# Patient Record
Sex: Male | Born: 1990 | Race: White | Hispanic: No | Marital: Single | Smoking: Current every day smoker
Health system: Southern US, Community
[De-identification: ages and names within clinical notes are randomized; demographics above are authoritative.]

---

## 2016-12-17 ENCOUNTER — Other Ambulatory Visit: Payer: Self-pay

## 2016-12-17 ENCOUNTER — Emergency Department (INDEPENDENT_AMBULATORY_CARE_PROVIDER_SITE_OTHER): Payer: BLUE CROSS/BLUE SHIELD

## 2016-12-17 ENCOUNTER — Emergency Department
Admission: EM | Admit: 2016-12-17 | Discharge: 2016-12-17 | Disposition: A | Discharge status: Home / Self Care | Payer: BLUE CROSS/BLUE SHIELD | Source: Home / Self Care | Attending: Family Medicine | Admitting: Family Medicine

## 2016-12-17 ENCOUNTER — Encounter: Payer: Self-pay | Admitting: *Deleted

## 2016-12-17 DIAGNOSIS — F17210 Nicotine dependence, cigarettes, uncomplicated: Secondary | ICD-10-CM

## 2016-12-17 DIAGNOSIS — R0602 Shortness of breath: Secondary | ICD-10-CM

## 2016-12-17 DIAGNOSIS — I10 Essential (primary) hypertension: Secondary | ICD-10-CM

## 2016-12-17 DIAGNOSIS — R079 Chest pain, unspecified: Secondary | ICD-10-CM

## 2016-12-17 LAB — POCT CBC W AUTO DIFF (K'VILLE URGENT CARE)

## 2016-12-17 LAB — COMPLETE METABOLIC PANEL WITH GFR
AG Ratio: 2.2 (calc) (ref 1.0–2.5)
ALT: 19 U/L (ref 9–46)
AST: 18 U/L (ref 10–40)
Albumin: 5 g/dL (ref 3.6–5.1)
Alkaline phosphatase (APISO): 36 U/L — ABNORMAL LOW (ref 40–115)
BUN: 8 mg/dL (ref 7–25)
CO2: 27 mmol/L (ref 20–32)
Calcium: 10 mg/dL (ref 8.6–10.3)
Chloride: 104 mmol/L (ref 98–110)
Creat: 0.81 mg/dL (ref 0.60–1.35)
GFR, Est African American: 142 mL/min/{1.73_m2} (ref >=60)
GFR, Est Non African American: 123 mL/min/{1.73_m2} (ref >=60)
Globulin: 2.3 g/dL (calc) (ref 1.9–3.7)
Glucose, Bld: 120 mg/dL — ABNORMAL HIGH (ref 65–99)
Potassium: 4.2 mmol/L (ref 3.5–5.3)
Sodium: 139 mmol/L (ref 135–146)
Total Bilirubin: 0.6 mg/dL (ref 0.2–1.2)
Total Protein: 7.3 g/dL (ref 6.1–8.1)

## 2016-12-17 LAB — TSH: TSH: 1.27 mIU/L (ref 0.40–4.50)

## 2016-12-17 MED ORDER — LISINOPRIL 10 MG PO TABS: 10.0000 mg | ORAL_TABLET | Freq: Every day | ORAL | 0 refills | Status: AC

## 2016-12-17 NOTE — ED Triage Notes (Signed)
Pt c/o LT sided chest tightness, red faced, and SOB intermittently x 1 day. He also c/o night sweats 2-3 nights. He took IBF  at 0345A without relief. He also c/o RT shoulder pain radiating to his chest today.

## 2016-12-17 NOTE — ED Provider Notes (Signed)
[facility]    CSN: [id number] Arrival date & time: 12/17/16  0803     History   Chief Complaint Chief Complaint  Patient presents with  . Chest Pain    HPI Jacob Horton is a 25 y.o. male.   HPI  Jacob Horton is a 26 y.o. male presenting to UC with c/o Left sided chest tightness, red face, and SOB intermittently for the last 24 hours.  He also reports 2-3 nights where he woke up with chills and sweats until he had to eventually get up for work in the morning. He reports being under increased stress recently. He has had panic attacks in the past but does not recall having night sweats.  He took  Ibuprofen at 3:45AM this morning w/o relief initially but denies CP at this time.  Also reports having intermittent Right shoulder pain radiating into his chest earlier this morning. Denies nausea or vomiting. Denies recent illness with cough or congestion.   He does smoke 0.5ppd cigarettes. Reports limited caffeine intake with 1 cup of coffee in the morning.  Denies leg pain or swelling. No hx of blood clots.  No prior hx of HTN or heart issues for pt but states his father had an MI in his 59s and he believes his grandfather had his first heart attack in his 89s or 52s.  Pt has not seen a PCP in about 3 years.   History reviewed. No pertinent past medical history.  There are no active problems to display for this patient.   History reviewed. No pertinent surgical history.     Home Medications    Prior to Admission medications   Medication Sig Start Date End Date Taking? Authorizing Provider  lisinopril (PRINIVIL,ZESTRIL) 10 MG tablet Take 1 tablet (10 mg total) by mouth daily. 12/17/16   Lurene Shadow, PA-C    Family History Family History  Problem Relation Age of Onset  . Hypertension Father   . Heart attack Father   . Heart attack Paternal Grandfather     Social History Social History  Substance Use Topics  . Smoking status: Current Every Day Smoker    Packs/day: 0.50    Types: Cigarettes  . Smokeless tobacco: Never Used  . Alcohol use Yes     Comment: occasionally      Allergies   Patient has no known allergies.   Review of Systems Review of Systems  Constitutional: Positive for chills and diaphoresis. Negative for fatigue and fever.  HENT: Negative for congestion, ear pain, sore throat, trouble swallowing and voice change.   Respiratory: Positive for chest tightness and shortness of breath. Negative for cough.   Cardiovascular: Positive for chest pain. Negative for palpitations.  Gastrointestinal: Negative for abdominal pain, diarrhea, nausea and vomiting.  Musculoskeletal: Negative for arthralgias, back pain and myalgias.  Skin: Negative for rash.  Neurological: Negative for dizziness, light-headedness and headaches.     Physical Exam Triage Vital Signs ED Triage Vitals  Enc Vitals Group     BP 12/17/16 0832 (!) 183/119     Pulse Rate 12/17/16 0832 97     Resp 12/17/16 0832 16     Temp 12/17/16 0832 98.3 F (36.8 C)     Temp Source 12/17/16 0832 Oral     SpO2 12/17/16 0832 100 %     Weight 12/17/16 0832 170 lb (77.1 kg)     Height 12/17/16 0832  (1.778 m)     Head Circumference --  Peak Flow --      Pain Score 12/17/16 0833 3     Pain Loc --      Pain Edu? --      Excl. in GC? --    No data found.   Updated Vital Signs BP (!) 171/102 (BP Location: Right Arm)   Pulse 97   Temp 98.3 F (36.8 C) (Oral)   Resp 16   Ht  (1.778 m)   Wt 170 lb (77.1 kg)   SpO2 100%   BMI 24.39 kg/m   Visual Acuity Right Eye Distance:   Left Eye Distance:   Bilateral Distance:    Right Eye Near:   Left Eye Near:    Bilateral Near:     Physical Exam  Constitutional: He is oriented to person, place, and time. He appears well-developed and well-nourished. No distress.  HENT:  Head: Normocephalic and atraumatic.  Right Ear: Tympanic membrane normal.  Left Ear: Tympanic membrane normal.  Nose: Nose  normal.  Mouth/Throat: Uvula is midline, oropharynx is clear and moist and mucous membranes are normal.  Eyes: EOM are normal.  Neck: Normal range of motion. Neck supple.  Cardiovascular: Normal rate, regular rhythm and normal heart sounds.   Pulmonary/Chest: Effort normal and breath sounds normal. No respiratory distress. He has no wheezes. He has no rales. He exhibits no tenderness.  Abdominal: Soft. He exhibits no distension. There is no tenderness.  Musculoskeletal: Normal range of motion.  Neurological: He is alert and oriented to person, place, and time.  Skin: Skin is warm and dry. Capillary refill takes less than 2 seconds. No rash noted. He is not diaphoretic. No erythema.  Psychiatric: He has a normal mood and affect. His behavior is normal.  Nursing note and vitals reviewed.    UC Treatments / Results  Labs (all labs ordered are listed, but only abnormal results are displayed) Labs Reviewed  COMPLETE METABOLIC PANEL WITH GFR  TSH  POCT CBC W AUTO DIFF ([facility])    EKG  EKG Interpretation Date/Time:12/17/2016   08:09:50 Ventricular Rate: 90 PR Interval: 132 QRS Duration: 100 QT Interval: 358 QTC Calculation: 437 P-R-T axes:  55   75   55 Text Interpretation: Normal sinus rhythm, nonspecific T wave abnormality. Abnormal EKG. No prior EKGs to compare.         Radiology Dg Chest 2 View  Result Date: 12/17/2016 CLINICAL DATA:  Shortness of breath, chest pain, and shoulder pain. History of smoking. EXAM: CHEST  2 VIEW COMPARISON:  None. FINDINGS: The cardiomediastinal silhouette is within normal limits. The lungs are well inflated and clear. There is no evidence of pleural effusion or pneumothorax. No acute osseous abnormality is identified. IMPRESSION: No active cardiopulmonary disease. Electronically Signed   By: Sebastian Ache M.D.   On: 12/17/2016 08:32    Procedures Procedures (including critical care time)  Medications Ordered in  UC Medications - No data to display   Initial Impression / Assessment and Plan / UC Course  I have reviewed the triage vital signs and the nursing notes.  Pertinent labs & imaging results that were available during my care of the patient were reviewed by me and considered in my medical decision making (see chart for details).     Doubt ACS or PE at this time CXR: no acute findings  CBC: WNL CMP and TSH: pending   BP elevated, gradually improved while pt in UC BP still elevated at time of discharge, will start  pt on low dose of Lisinopril. Encouraged f/u with PCP for further evaluation and management of HTN  Discussed symptoms that warrant emergent care in the ED.    Final Clinical Impressions(s) / UC Diagnoses   Final diagnoses:  Nonspecific chest pain  Essential hypertension, benign  Cigarette smoker    New Prescriptions Discharge Medication List as of 12/17/2016  8:55 AM    START taking these medications   Details  lisinopril (PRINIVIL,ZESTRIL) 10 MG tablet Take 1 tablet (10 mg total) by mouth daily., Starting Wed 12/17/2016, Normal         Controlled Substance Prescriptions [place] Controlled Substance Registry consulted? Not Applicable   Rolla Plate 12/17/16 1610

## 2016-12-18 ENCOUNTER — Telehealth: Payer: Self-pay | Admitting: *Deleted

## 2016-12-18 NOTE — Telephone Encounter (Signed)
Patient reports he is feeling some better today. Notified of labs results and discussed. He has a PCP apt on Nov 1st.

## 2017-01-08 DEATH — deceased

## 2018-07-15 IMAGING — DX DG CHEST 2V
2 series · 2 of 2 positions shown · non-contrast
Comparison: None.

CLINICAL DATA: Shortness of breath, chest pain, and shoulder pain.
History of smoking.

EXAM:
CHEST  2 VIEW

[chest pa]
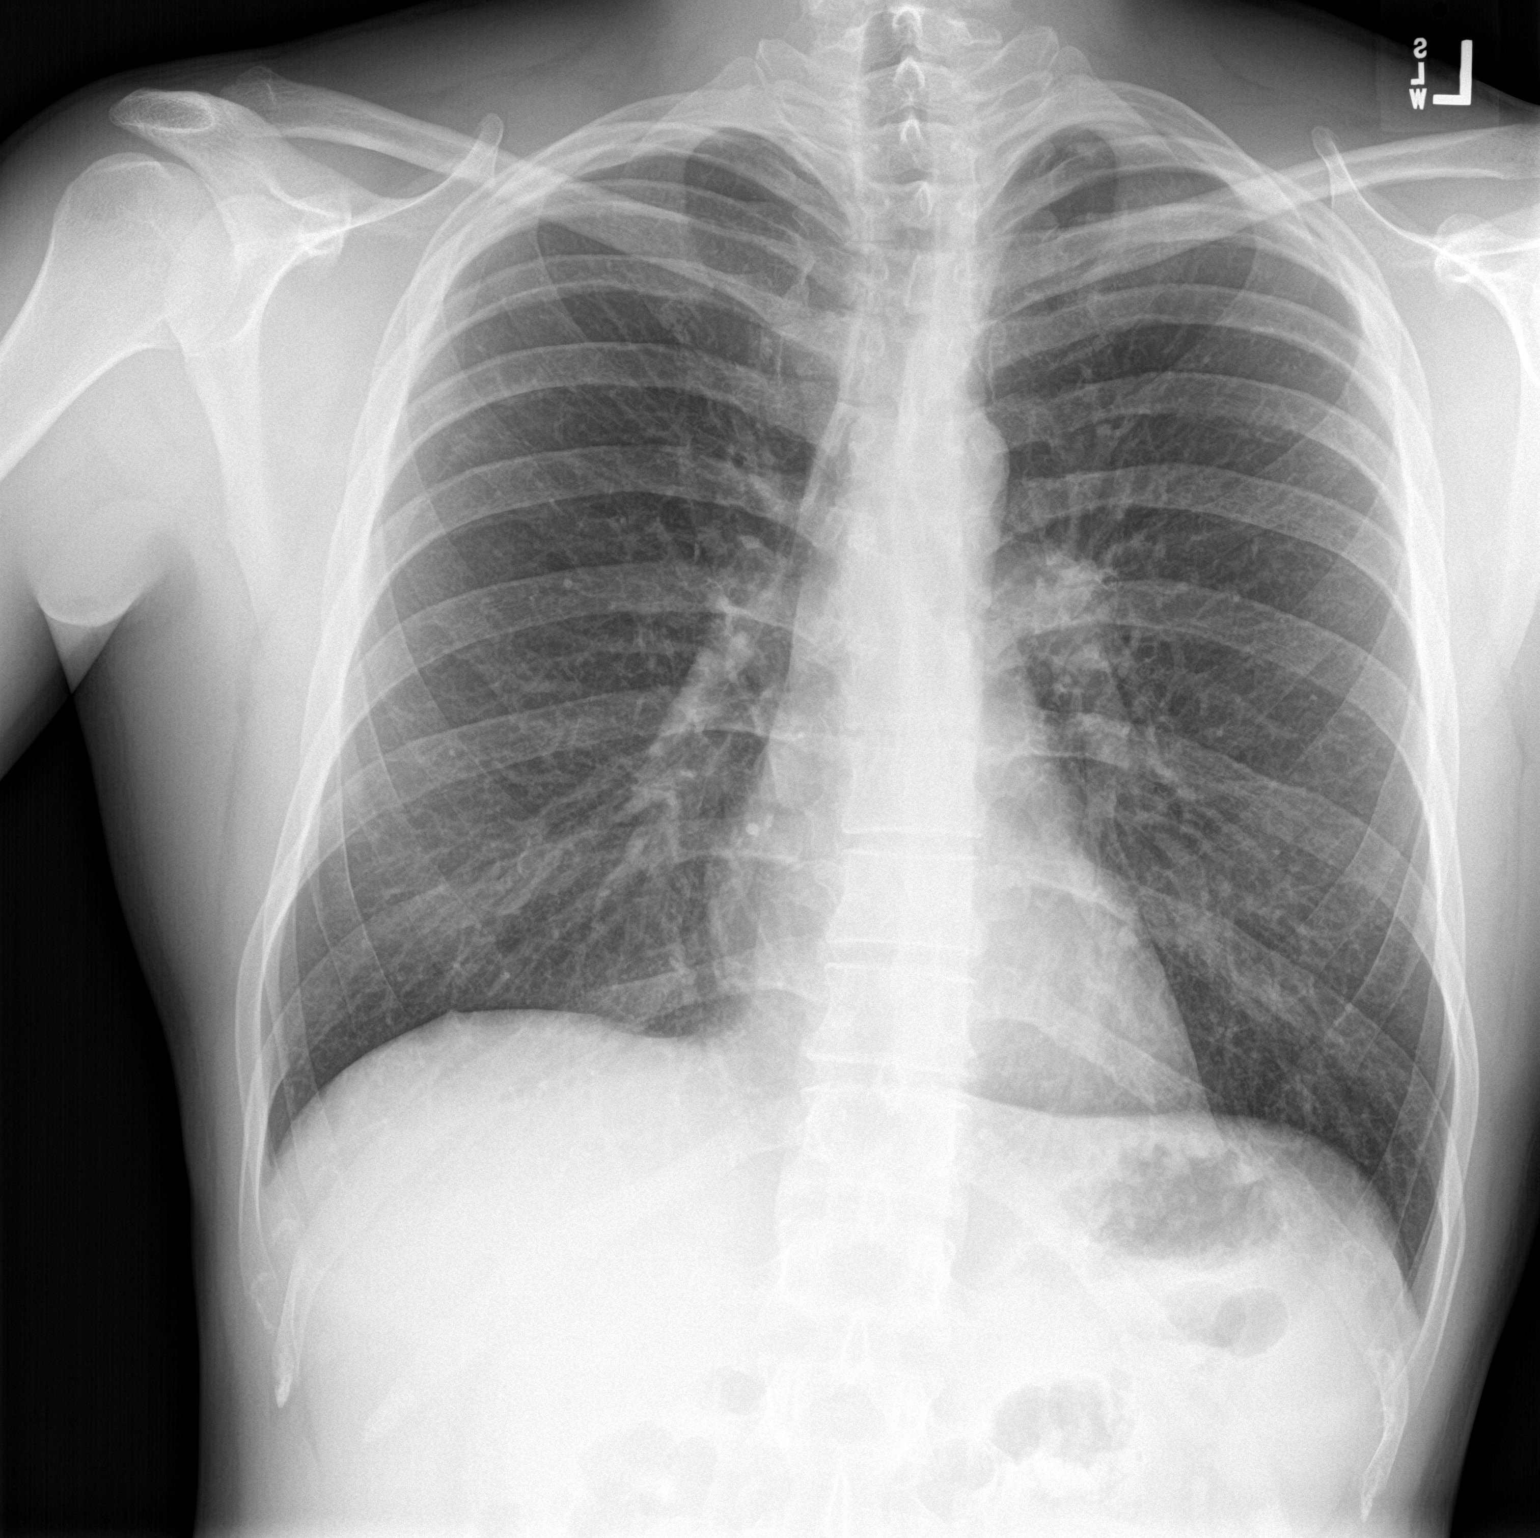

[chest lat]
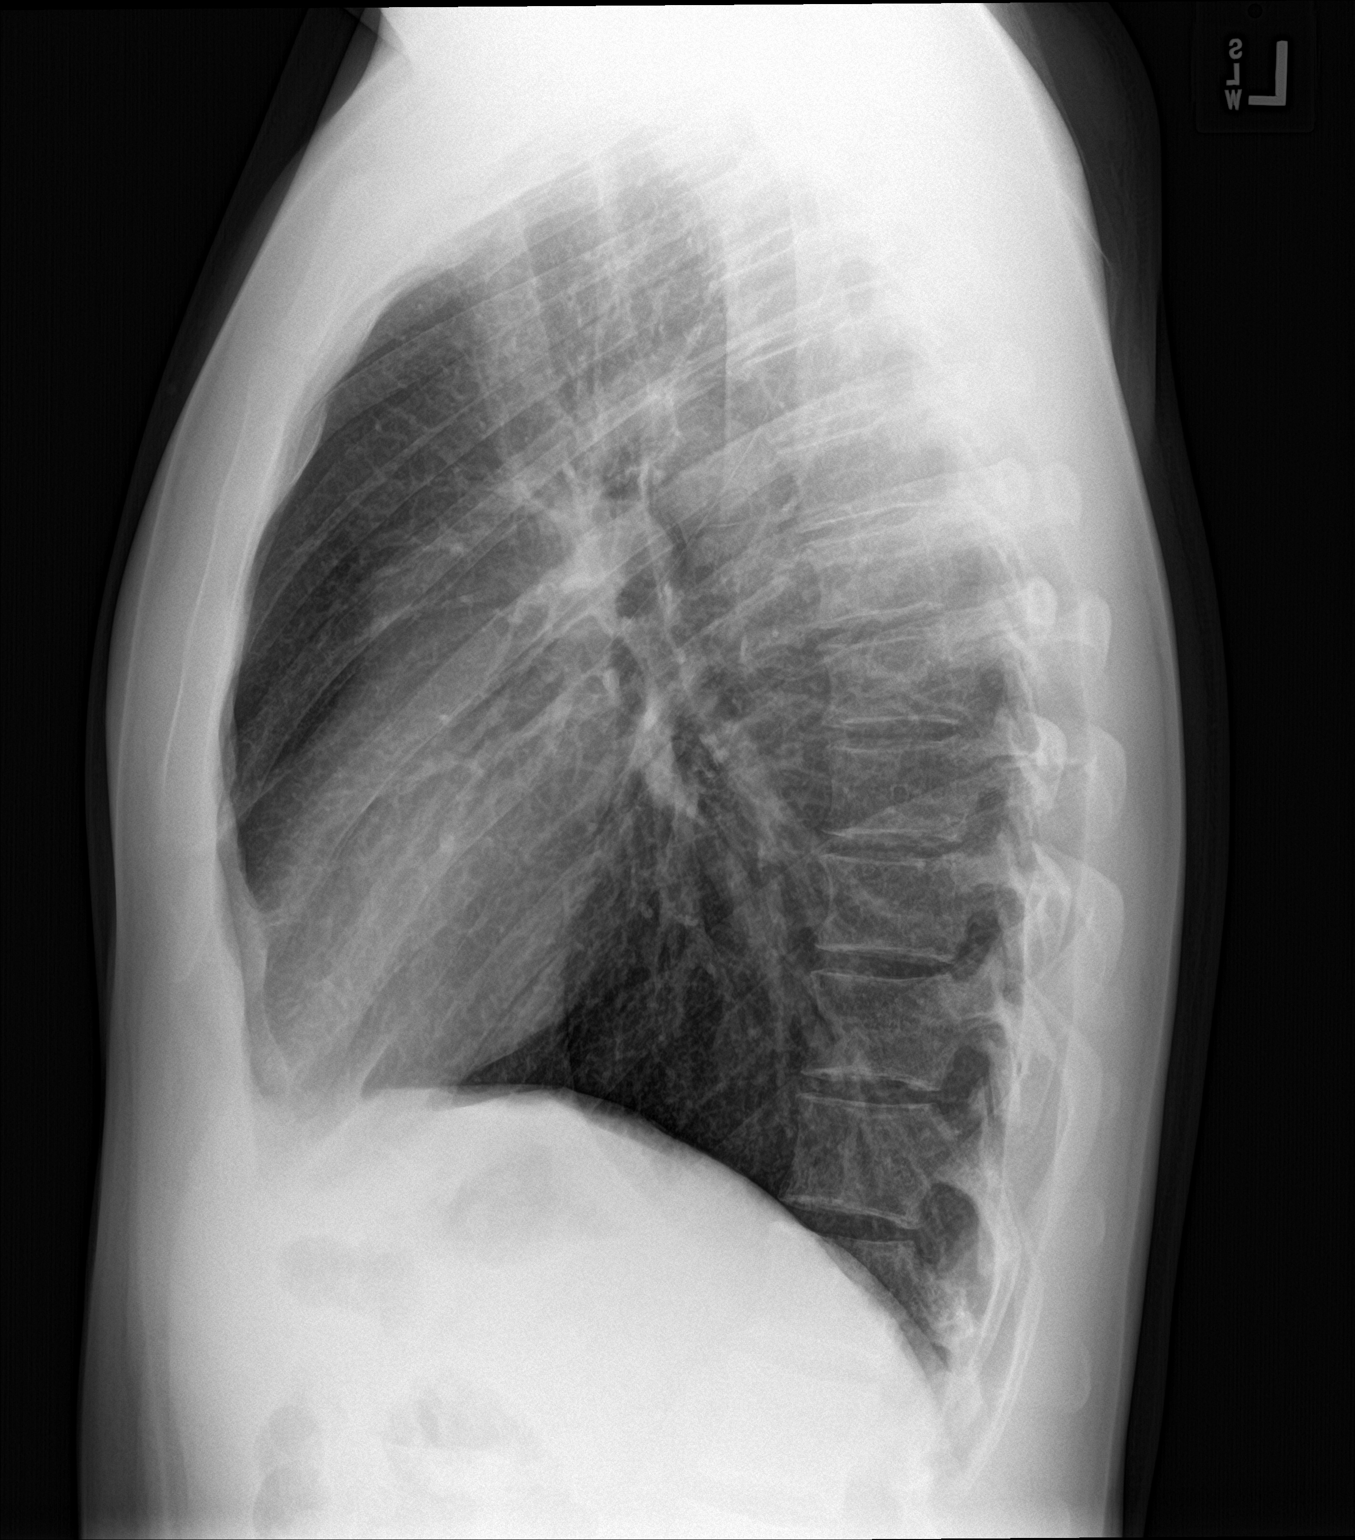

[2 of 2 positions shown; findings below may reference images not displayed]

FINDINGS: The cardiomediastinal silhouette is within normal limits. The lungs
are well inflated and clear. There is no evidence of pleural
effusion or pneumothorax. No acute osseous abnormality is
identified.
IMPRESSION: No active cardiopulmonary disease.
